# Patient Record
Sex: Male | Born: 1970 | Race: White | Hispanic: No | Marital: Married | Smoking: Current every day smoker
Health system: Southern US, Community
[De-identification: ages and names within clinical notes are randomized; demographics above are authoritative.]

## PROBLEM LIST (undated history)

## (undated) DIAGNOSIS — Q211 Atrial septal defect: Secondary | ICD-10-CM

## (undated) DIAGNOSIS — I340 Nonrheumatic mitral (valve) insufficiency: Secondary | ICD-10-CM

## (undated) DIAGNOSIS — R55 Syncope and collapse: Secondary | ICD-10-CM

## (undated) HISTORY — PX: TESTICLE SURGERY: SHX794

---

## 2013-10-19 DIAGNOSIS — Q2112 Patent foramen ovale: Secondary | ICD-10-CM

## 2013-10-19 DIAGNOSIS — Q211 Atrial septal defect: Secondary | ICD-10-CM

## 2013-10-19 DIAGNOSIS — I340 Nonrheumatic mitral (valve) insufficiency: Secondary | ICD-10-CM

## 2013-10-19 HISTORY — DX: Patent foramen ovale: Q21.12

## 2013-10-19 HISTORY — DX: Atrial septal defect: Q21.1

## 2013-10-19 HISTORY — DX: Nonrheumatic mitral (valve) insufficiency: I34.0

## 2013-12-25 HISTORY — PX: LOOP RECORDER IMPLANT: SHX5954

## 2014-09-23 ENCOUNTER — Emergency Department (HOSPITAL_COMMUNITY): Payer: Medicaid Other

## 2014-09-23 ENCOUNTER — Encounter (HOSPITAL_COMMUNITY): Payer: Self-pay | Admitting: Emergency Medicine

## 2014-09-23 ENCOUNTER — Emergency Department (HOSPITAL_COMMUNITY)
Admission: EM | Admit: 2014-09-23 | Discharge: 2014-09-23 | Disposition: A | Discharge status: Home / Self Care | Payer: Medicaid Other | Attending: Emergency Medicine | Admitting: Emergency Medicine

## 2014-09-23 DIAGNOSIS — R079 Chest pain, unspecified: Secondary | ICD-10-CM | POA: Diagnosis present

## 2014-09-23 DIAGNOSIS — Z720 Tobacco use: Secondary | ICD-10-CM | POA: Insufficient documentation

## 2014-09-23 DIAGNOSIS — Z79899 Other long term (current) drug therapy: Secondary | ICD-10-CM | POA: Insufficient documentation

## 2014-09-23 DIAGNOSIS — Q211 Atrial septal defect: Secondary | ICD-10-CM | POA: Insufficient documentation

## 2014-09-23 DIAGNOSIS — R55 Syncope and collapse: Secondary | ICD-10-CM | POA: Diagnosis not present

## 2014-09-23 DIAGNOSIS — R0789 Other chest pain: Secondary | ICD-10-CM | POA: Diagnosis not present

## 2014-09-23 DIAGNOSIS — Z8679 Personal history of other diseases of the circulatory system: Secondary | ICD-10-CM | POA: Insufficient documentation

## 2014-09-23 HISTORY — DX: Atrial septal defect: Q21.1

## 2014-09-23 HISTORY — DX: Syncope and collapse: R55

## 2014-09-23 HISTORY — DX: Nonrheumatic mitral (valve) insufficiency: I34.0

## 2014-09-23 LAB — BASIC METABOLIC PANEL
Anion gap: 12 (ref 5–15)
BUN: 11 mg/dL (ref 6–23)
CO2: 22 mEq/L (ref 19–32)
Calcium: 9.3 mg/dL (ref 8.4–10.5)
Chloride: 105 mEq/L (ref 96–112)
Creatinine, Ser: 0.85 mg/dL (ref 0.50–1.35)
GFR calc Af Amer: >90 mL/min (ref >90)
GFR calc non Af Amer: >90 mL/min (ref >90)
Glucose, Bld: 96 mg/dL (ref 70–99)
Potassium: 4.1 mEq/L (ref 3.7–5.3)
Sodium: 139 mEq/L (ref 137–147)

## 2014-09-23 LAB — CBC
HCT: 41.9 % (ref 39.0–52.0)
Hemoglobin: 15.1 g/dL (ref 13.0–17.0)
MCH: 29.5 pg (ref 26.0–34.0)
MCHC: 36 g/dL (ref 30.0–36.0)
MCV: 82 fL (ref 78.0–100.0)
Platelets: 230 10*3/uL (ref 150–400)
RBC: 5.11 MIL/uL (ref 4.22–5.81)
RDW: 12.2 % (ref 11.5–15.5)
WBC: 7.5 10*3/uL (ref 4.0–10.5)

## 2014-09-23 LAB — I-STAT TROPONIN, ED
Troponin i, poc: 0 ng/mL (ref 0.00–0.08)
Troponin i, poc: 0 ng/mL (ref 0.00–0.08)

## 2014-09-23 LAB — TROPONIN I

## 2014-09-23 MED ORDER — ASPIRIN 325 MG PO TABS
325.0000 mg | ORAL_TABLET | Freq: Once | ORAL | Status: AC
  Administered 2014-09-23: 325 mg via ORAL
  Filled 2014-09-23: qty 1

## 2014-09-23 MED ORDER — NITROGLYCERIN 0.4 MG SL SUBL: 0.4000 mg | SUBLINGUAL_TABLET | SUBLINGUAL | Status: AC | PRN

## 2014-09-23 MED ORDER — IOHEXOL 350 MG/ML SOLN
100.0000 mL | Freq: Once | INTRAVENOUS | Status: AC | PRN
  Administered 2014-09-23: 100 mL via INTRAVENOUS

## 2014-09-23 MED ORDER — MORPHINE SULFATE 4 MG/ML IJ SOLN
4.0000 mg | Freq: Once | INTRAMUSCULAR | Status: AC
  Administered 2014-09-23: 4 mg via INTRAVENOUS
  Filled 2014-09-23: qty 1

## 2014-09-23 NOTE — ED Provider Notes (Signed)
CSN: [id number]     Arrival date & time 09/23/14  0945 History   First MD Initiated Contact with Patient 09/23/14 1007     Chief Complaint  Patient presents with  . Chest Pain     (Consider location/radiation/quality/duration/timing/severity/associated sxs/prior Treatment) HPI Pt is a 43yo male with hx of syncopal episodes for 2 years presenting to ED with c/o right right shoulder pain radiating into center of chest.  Pain started around 5AM this morning, and mild and dull, progressively worsening to sharp, 10/10 at worse. 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  States he has had a TEE, stress test, EEG and brain MRI only to find a small hole in his heart and a "leaky valve" but states cardiology was not concerned with these findings and did not believe they were cause of pt's syncopal episodes. Denies having syncope today.  CP will come and go w/o known aggravating factors. Pt states it has come while he has been sitting in a chair, other times, has been while he is working on [employer] site. Besides findings on TEE, no other personal cardiac hx, denies family hx of CAD or sudden cardiac death.    Past Medical History  Diagnosis Date  . Syncopal episodes   . Mitral regurgitation 10/2013    seen on TEE  . PFO (patent foramen ovale) 10/2013    seen on TEE   Past Surgical History  Procedure Laterality Date  . Loop recorder implant  12/25/2013  . Testicle surgery      exploration of undescended testicle   Family History  Problem Relation Age of Onset  . Diabetes Father   . Hypertension Mother    History  Substance Use Topics  . Smoking status: Current Every Day Smoker -- 20 years  . Smokeless tobacco: Never Used  . Alcohol Use: Yes     Comment: Rare, no abuse    Review of Systems  Constitutional: Negative for fever, chills, diaphoresis and fatigue.  HENT:  Negative for sore throat, trouble swallowing and voice change.   Respiratory: Negative for cough, chest tightness, shortness of breath and wheezing.   Cardiovascular: Positive for chest pain (sharp, aching, right side of chest, radiaing into center). Negative for palpitations and leg swelling.  Gastrointestinal: Negative for nausea, vomiting and abdominal pain.  Neurological: Positive for syncope. Negative for weakness, light-headedness, numbness and headaches.      Allergies  Review of patient's allergies indicates no known allergies.  Home Medications   Prior to Admission medications   Medication Sig Start Date End Date Taking? Authorizing Provider  citalopram (CELEXA) 10 MG tablet Take 20 mg by mouth at bedtime.  07/21/14 07/22/15 Yes Historical Provider, MD  ibuprofen (ADVIL,MOTRIN) 200 MG tablet Take 600 mg by mouth every 8 (eight) hours as needed for headache (pain).   Yes Historical Provider, MD  nitroGLYCERIN (NITROSTAT) 0.4 MG SL tablet Place 1 tablet (0.4 mg total) under the tongue every 5 (five) minutes as needed for chest pain. 09/23/14   Rhonda G Barrett, PA-C   BP 104/54  Pulse 67  Temp(Src) 98.4 F (36.9 C) (Oral)  Resp 22  SpO2 100% Physical Exam  Nursing note and vitals reviewed. Constitutional: He is oriented to person, place, and time. He appears well-developed and well-nourished. He appears distressed.  Well nourished, tall male sitting up in exam bed, appears uncomfortable but non-toxic appearing.  HENT:  Head: Normocephalic and atraumatic.  Eyes: Conjunctivae are normal. No scleral icterus.  Neck: Normal range of motion. Neck supple.  Cardiovascular: Normal rate and regular rhythm.   Murmur heard. Pulmonary/Chest: Effort normal and breath sounds normal. No respiratory distress. He has no wheezes. He has no rales. He exhibits no tenderness.  No respiratory distress, able to speak in full sentences w/o difficulty. Lungs: CTAB  Abdominal: Soft. Bowel sounds are  normal. He exhibits no distension and no mass. There is no tenderness. There is no rebound and no guarding.  Musculoskeletal: Normal range of motion.  Neurological: He is alert and oriented to person, place, and time. No cranial nerve deficit. Coordination normal.  Alert to person, place and time. Speech fluent. 5/5 strength upper and lower extremities bilaterally  Skin: Skin is warm and dry. He is not diaphoretic.    ED Course  Procedures (including critical care time) Labs Review Labs Reviewed  CBC  BASIC METABOLIC PANEL  TROPONIN I  I-STAT TROPOININ, ED  Rosezena Sensor, ED    Imaging Review Dg Chest 2 View  09/23/2014   CLINICAL DATA:  Chest pain with radiation right shoulder; syncope earlier today. Difficulty breathing associated with the pain  EXAM: CHEST  2 VIEW  COMPARISON:  None.  FINDINGS: A monitor overlies the anterior left hemithorax. Lungs are clear. Heart size and pulmonary vascularity are normal. No pneumothorax. No adenopathy. No bone lesions. There is lower thoracic levoscoliosis.  IMPRESSION: No edema or consolidation.   Electronically Signed   By: Bretta Bang M.D.   On: 09/23/2014 11:49   Ct Angio Chest Aorta W/cm &/or Wo/cm  09/23/2014   CLINICAL DATA:  Right-sided chest pain of acute onset  EXAM: CT ANGIOGRAPHY CHEST WITH CONTRAST  TECHNIQUE: Initially, axial CT images were obtained through the chest without intravenous contrast material administration. Multidetector CT imaging of the chest was performed using the standard protocol during bolus administration of intravenous contrast. Multiplanar CT image reconstructions and MIPs were obtained to evaluate the vascular anatomy.  CONTRAST:  OMNIPAQUE IOHEXOL 350 MG/ML SOLN  COMPARISON:  Chest radiograph September 23, 2014  FINDINGS: There is no demonstrable pulmonary embolus. There is no thoracic aortic aneurysm or dissection. Great vessels appear normal in their visualized regions.  On axial slice 29 series 6,  there is a 2 mm nodular lesion in the anterior segment of the right upper lobe. Also on slice 29 series 6, there is a 2 mm nodular lesion there is a 2 mm nodular lesion posterior segment of the right upper lobe. On slice 30 series 2, there is a 2 mm nodular opacity in the posterior segment of the right upper lobe. On axial slice 56 series 6, there is a 5 mm nodular opacity in the medial aspect of the superior lingula. There is no edema or consolidation.  A monitor device is noted anterior soft tissues of the left hemithorax, midportion. There is no appreciable thoracic adenopathy. The pericardium is not thickened.  In the visualized upper abdomen, there is fatty change in the liver. There is a 6 mm adenoma in the left adrenal, a benign finding. Visualized thyroid appears normal. There are no blastic or lytic bone lesions.  Review of the MIP images confirms the above findings.  IMPRESSION: No thoracic aortic aneurysm or dissection. No demonstrable pulmonary embolus. No edema or consolidation. Nodular lesions in the lungs, largest measuring 5 mm. Followup of these nodular opacity should be based on Fleischner Society guidelines. If the  patient is at high risk for bronchogenic carcinoma, follow-up chest CT at 6-12 months is recommended. If the patient is at low risk for bronchogenic carcinoma, follow-up chest CT at 12 months is recommended. This recommendation follows the consensus statement: Guidelines for Management of Small Pulmonary Nodules Detected on CT Scans: A Statement from the Fleischner Society as published in Radiology 2005;237:395-400.  Fatty liver.  Small left adrenal adenoma.   Electronically Signed   By: Bretta BangWilliam  Woodruff M.D.   On: 09/23/2014 12:24     EKG Interpretation   Date/Time:  Tuesday September 23 2014 09:51:09 EDT Ventricular Rate:  87 PR Interval:  180 QRS Duration: 118 QT Interval:  368 QTC Calculation: 442 R Axis:   111 Text Interpretation:  Normal sinus rhythm Right atrial  enlargement Right  axis deviation Pulmonary disease pattern Incomplete right bundle branch  block Right ventricular hypertrophy Abnormal ECG agree. no STEMI Confirmed  by Donnald GarrePfeiffer, MD, Lebron ConnersMarcy [phone]([id number]) on 09/23/2014 2:58:20 PM      MDM   Final diagnoses:  Other chest pain    Pt is a 43yo male with of idiopathic syncope presenting to ED with worsening chest pain that has been intermittent for about 2 months but worse today. Started around 5AM.    Per medical records found through [facility], as of 11/05/13, pt [facility], found to have a 3+ mitral regurgitation with PFO via TEE. "These findings were presented to the patient and his wife. Both were known issues. 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   2:48 PM Consulted with cardiology, Dr. Herbie BaltimoreHarding, who examined pt. Pt states he does have a f/u appointment with his cardiologist for Monday, 10/12.  Will get a repeat troponin, if normal, pt may be discharged home to f/u with cardiologist as previously scheduled.    Junius Finnerrin O'Malley, PA-C 09/23/14 [phone]

## 2014-09-23 NOTE — ED Notes (Signed)
returned from ct

## 2014-09-23 NOTE — ED Provider Notes (Signed)
Medical screening examination/treatment/procedure(s) were conducted as a shared visit with non-physician practitioner(s) and myself.  I personally evaluated the patient during the encounter.   EKG Interpretation   Date/Time:  Tuesday September 23 2014 09:51:09 EDT Ventricular Rate:  87 PR Interval:  180 QRS Duration: 118 QT Interval:  368 QTC Calculation: 442 R Axis:   111 Text Interpretation:  Normal sinus rhythm Right atrial enlargement Right  axis deviation Pulmonary disease pattern Incomplete right bundle branch  block Right ventricular hypertrophy Abnormal ECG agree. no STEMI Confirmed  by Donnald GarrePfeiffer, MD, Lebron ConnersMarcy [phone]([id number]) on 09/23/2014 2:58:20 PM     I have assessed and examined the patient. Consultation was placed with Dr. Susette RacerHardy of cardiology. At this point time it has been determined that he is stable for discharge with continued followup with his cardiologist which is scheduled for Monday.  Arby BarretteMarcy Adelbert Gaspard, MD 09/23/14 [phone]

## 2014-09-23 NOTE — ED Notes (Signed)
Cardiology with pt.  

## 2014-09-23 NOTE — ED Notes (Signed)
Pt to radiology.

## 2014-09-23 NOTE — H&P (Signed)
History and Physical   Patient ID: KHUP SAPIA MRN: [id number], DOB/AGE: 41-20-1972 41 y.o. Date of Encounter: 09/23/2014  Primary Physician: None Primary Cardiologist: Landry Corporal, MD at [facility]  Chief Complaint:  Chest pain  HPI: Connor Henson is a 43 y.o. male with no history of CAD. He has had syncope in the past, multiple episodes, and has a loop recorder and previous echocardiogram was normal.  Today, he was in his usual state of health. He woke with upper right back pain that radiated around to his chest as an ache and sharp pain, at about 5 am. He went to work and loaded the [employer]. He was working when the pain became much worse, up to a 10/10.   The pain briefly made him SOB, but then was OK, he was a little diaphoretic, no N&V. Took no medications. He fell to his knees, the pain was severe when he was trying to breathe. Brought to ER by co-workers, pain gradually eased off, he was given ASA 325 mg and MSO4 4 mg. The pain is now 4-5/10. This is the lowest it has been all day. Last episode 2-3 days ago. 6/10 at its worst, lasted about 2 hours. The episodes are not associated with exertion. He has been getting these 2-3 x week for the last 2-3 months.   History of syncope, classified as vasovagal by his neurologist. Last syncopal episode was last week. They happen a little over 2 x month on average. Evaluated at [facility]. Positive tilt table, loop recorder implanted early this year with no arrhythmia found.    Past Medical History  Diagnosis Date  . Syncopal episodes   . Mitral regurgitation 10/2013    seen on TEE  . PFO (patent foramen ovale) 10/2013    seen on TEE    Surgical History:  Past Surgical History  Procedure Laterality Date  . Loop recorder implant  12/25/2013  . Testicle surgery      exploration of undescended testicle     I have reviewed the patient's current medications. Prior to Admission medications   Medication Sig Start Date End Date  Taking? Authorizing Provider  citalopram (CELEXA) 10 MG tablet Take 20 mg by mouth at bedtime.  07/21/14 07/22/15 Yes Historical Provider, MD  ibuprofen (ADVIL,MOTRIN) 200 MG tablet Take 600 mg by mouth every 8 (eight) hours as needed for headache (pain).   Yes Historical Provider, MD   Allergies: No Known Allergies  History   Social History  . Marital Status: Married    Spouse Name: N/A    Number of Children: N/A  . Years of Education: N/A   Occupational History  . Electrician    Social History Main Topics  . Smoking status: Current Every Day Smoker -- 20 years  . Smokeless tobacco: Never Used  . Alcohol Use: Yes     Comment: Rare, no abuse  . Drug Use: Yes    Special: Marijuana     Comment: Former user, extremely rare in the last 7 years  . Sexual Activity: Not on file   Other Topics Concern  . Not on file   Social History Narrative   Lives with family.    Family History  Problem Relation Age of Onset  . Diabetes Father   . Hypertension Mother    Family Status  Relation Status Death Age  . Mother Alive   . Father Alive     Review of Systems:  Full 14-point review of systems otherwise negative except as noted above.  Physical Exam: Blood pressure 102/68, pulse 62, temperature 98.4 F (36.9 C), temperature source Oral, resp. rate 18, SpO2 100.00%. General: Well developed, well nourished,male in no acute distress. Head: Normocephalic, atraumatic, sclera non-icteric, no xanthomas, nares are without discharge. Dentition: good Neck: No carotid bruits. JVD not elevated. No thyromegally Lungs: Good expansion bilaterally. without wheezes or rhonchi. Prolonged expiratory phase Heart: Regular rate and rhythm with S1 S2.  No S3 or S4.  2/6 murmur, no rubs, or gallops appreciated. Abdomen: Soft, non-tender, non-distended with normoactive bowel sounds. No hepatomegaly. No rebound/guarding. No obvious abdominal masses. Msk:  Strength and tone appear normal for age. No joint  deformities or effusions, no spine or costo-vertebral angle tenderness. Extremities: No clubbing or cyanosis. No edema.  Distal pedal pulses are 2+ in 4 extrem Neuro: Alert and oriented X 3. Moves all extremities spontaneously. No focal deficits noted. Psych:  Responds to questions appropriately with a normal affect. Skin: No rashes or lesions noted  Labs:   Lab Results  Component Value Date   WBC 7.5 09/23/2014   HGB 15.1 09/23/2014   HCT 41.9 09/23/2014   MCV 82.0 09/23/2014   PLT 230 09/23/2014     Recent Labs Lab 09/23/14 1005  NA 139  K 4.1  CL 105  CO2 22  BUN 11  CREATININE 0.85  CALCIUM 9.3  GLUCOSE 96    Recent Labs  09/23/14 1018  TROPIPOC 0.00    Radiology/Studies: Dg Chest 2 View 09/23/2014   CLINICAL DATA:  Chest pain with radiation right shoulder; syncope earlier today. Difficulty breathing associated with the pain  EXAM: CHEST  2 VIEW  COMPARISON:  None.  FINDINGS: A monitor overlies the anterior left hemithorax. Lungs are clear. Heart size and pulmonary vascularity are normal. No pneumothorax. No adenopathy. No bone lesions. There is lower thoracic levoscoliosis.  IMPRESSION: No edema or consolidation.   Electronically Signed   By: Bretta BangWilliam  Woodruff M.D.   On: 09/23/2014 11:49   Ct Angio Chest Aorta W/cm &/or Wo/cm 09/23/2014   CLINICAL DATA:  Right-sided chest pain of acute onset  EXAM: CT ANGIOGRAPHY CHEST WITH CONTRAST  TECHNIQUE: Initially, axial CT images were obtained through the chest without intravenous contrast material administration. Multidetector CT imaging of the chest was performed using the standard protocol during bolus administration of intravenous contrast. Multiplanar CT image reconstructions and MIPs were obtained to evaluate the vascular anatomy.  CONTRAST:  100mL OMNIPAQUE IOHEXOL 350 MG/ML SOLN  COMPARISON:  Chest radiograph September 23, 2014  FINDINGS: There is no demonstrable pulmonary embolus. There is no thoracic aortic aneurysm or dissection.  Great vessels appear normal in their visualized regions.  On axial slice 29 series 6, there is a 2 mm nodular lesion in the anterior segment of the right upper lobe. Also on slice 29 series 6, there is a 2 mm nodular lesion there is a 2 mm nodular lesion posterior segment of the right upper lobe. On slice 30 series 2, there is a 2 mm nodular opacity in the posterior segment of the right upper lobe. On axial slice 56 series 6, there is a 5 mm nodular opacity in the medial aspect of the superior lingula. There is no edema or consolidation.  A monitor device is noted anterior soft tissues of the left hemithorax, midportion. There is no appreciable thoracic adenopathy. The pericardium is not thickened.  In the visualized upper abdomen, there is fatty change in  the liver. There is a 6 mm adenoma in the left adrenal, a benign finding. Visualized thyroid appears normal. There are no blastic or lytic bone lesions.  Review of the MIP images confirms the above findings.  IMPRESSION: No thoracic aortic aneurysm or dissection. No demonstrable pulmonary embolus. No edema or consolidation. Nodular lesions in the lungs, largest measuring 5 mm. Followup of these nodular opacity should be based on Fleischner Society guidelines. If the patient is at high risk for bronchogenic carcinoma, follow-up chest CT at 6-12 months is recommended. If the patient is at low risk for bronchogenic carcinoma, follow-up chest CT at 12 months is recommended. This recommendation follows the consensus statement: Guidelines for Management of Small Pulmonary Nodules Detected on CT Scans: A Statement from the Fleischner Society as published in Radiology 2005;237:395-400.  Fatty liver.  Small left adrenal adenoma.   Electronically Signed   By: Bretta Bang M.D.   On: 09/23/2014 12:24   Transesophageal Echo: 10/2013 Interpretation Summary A transesophageal echocardiogram with Doppler and color flow Doppler was performed. Saline contrast injection  was performed. There is no comparison study available. A patent foramen ovale is suspected. Injection of contrast documented an interatrial shunt. The left ventricle is normal in size, wall thickness and wall motion with ejection fraction of 60-65%. The right ventricular ejection fraction is grossly normal. There is mild tricuspid regurgitation (1+). Left Ventricle The left ventricle is normal in size, wall thickness and wall motion with ejection fraction of 60-65%. Right Ventricle The right ventricle is normal size. The right ventricular ejection fraction is grossly normal. Atria A patent foramen ovale is suspected. Injection of contrast documented an interatrial shunt. The left atrium is normal size. The right atrium is normal. Mitral Valve The mitral valve is mildly thickened. Anterior leaflet is more thickened. There is 3+ MR, eccentric jet. No flow reversal. Tricuspid Valve The tricuspid valve leaflets are thin and pliable and the valve motion is normal. There is mild tricuspid regurgitation (1+). Aortic Valve The aortic valve is tri-leaflet with thin, pliable leaflets that move normally. Pulmonic Valve The pulmonic valve leaflets are thin and pliable; valve motion is normal. Vessels The aortic root is normal. The pulmonary artery is normal. Pericardium There is no pericardial effusion.   ECG: SR, Incomplete RBBB  ASSESSMENT AND PLAN:  Principal Problem:   Chest pain, moderate coronary artery risk - MD advise on rechecking enzymes and discharge if negative, for outpatient stress test with primary cardiologist. MD advise on adding SLNTG.  SignedTheodore Demark, PA-C 09/23/2014 2:35 PM Beeper [phone]  I saw & examined the patient in the ER along with Theodore Demark, PA-C.   He has a long H/o of syncope -- with detailed evaluation by his primary Cardiologist.  Although I cannot find a ST result, he indicates that he thinks he has had a ST.  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    His current complaint is essentially R shoulder pain radiating from the trapezius to clavicle then spreads across chest. The symptom is persistent & not exacerbated by physical activity or shoulder manipulation. While I think that is is not unreasonable to assess with a Stress Test, I think that he is overall Low Risk from a Cardiac Standpoint with Negative Troponin Levels.  He has an Appt with his cardiologist this Monday.  Would defer evaluation to his cardiologist & to be done where the rest of his records are. Agree with PRN SL NTG.  BP & HR  are stable.    He is OK to d/c from the ER give Neg Trop ~12 hr post onset of constant pain.  Marykay Lex, M.D., M.S. Interventional Cardiologist   Pager # [phone]

## 2014-09-23 NOTE — ED Notes (Signed)
Pt c/o right sided CP starting at shoulder and into chest x 2 years intermittent that is worse today; pt sts hx of idiopathic syncope but denies today

## 2014-09-23 NOTE — ED Notes (Signed)
Patient was given a sprite to drink.

## 2014-09-23 NOTE — Discharge Instructions (Signed)

## 2014-09-30 NOTE — ED Provider Notes (Signed)
Medical screening examination/treatment/procedure(s) were conducted as a shared visit with non-physician practitioner(s) and myself.  I personally evaluated the patient during the encounter.   EKG Interpretation   Date/Time:  Tuesday September 23 2014 09:51:09 EDT Ventricular Rate:  87 PR Interval:  180 QRS Duration: 118 QT Interval:  368 QTC Calculation: 442 R Axis:   111 Text Interpretation:  Normal sinus rhythm Right atrial enlargement Right  axis deviation Pulmonary disease pattern Incomplete right bundle branch  block Right ventricular hypertrophy Abnormal ECG agree. no STEMI Confirmed  by Donnald GarrePfeiffer, MD, Lebron ConnersMarcy [phone]([id number]) on 09/23/2014 2:58:20 PM       Arby BarretteMarcy Auna Mikkelsen, MD 09/30/14 [phone]

## 2016-04-29 IMAGING — CT CT ANGIO CHEST
2 of 8 series · 18 of 46 positions shown · IV contrast (OMNI)
Comparison: Chest radiograph September 23, 2014

CLINICAL DATA: Right-sided chest pain of acute onset

EXAM:
CT ANGIOGRAPHY CHEST WITH CONTRAST
TECHNIQUE: Initially, axial CT images were obtained through the chest without
intravenous contrast material administration. Multidetector CT
imaging of the chest was performed using the standard protocol
during bolus administration of intravenous contrast. Multiplanar CT
image reconstructions and MIPs were obtained to evaluate the
vascular anatomy.
CONTRAST:  100mL OMNIPAQUE IOHEXOL 350 MG/ML SOLN

[Series 5: dissection 3.0 i30f 3 · axial · 0.78mm/px · z∈[-379,-43]mm · 15 of 126 slices shown]
[im 7/126  lung]
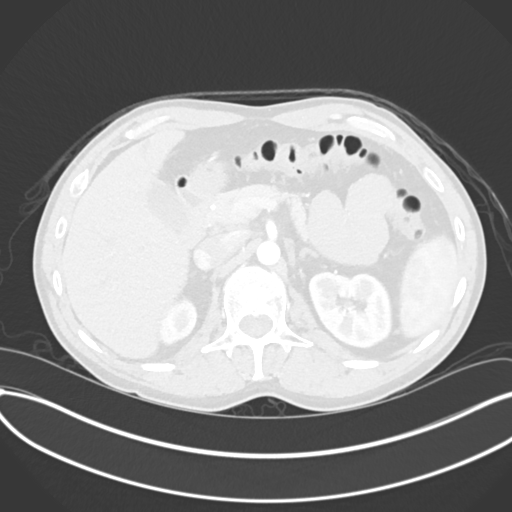
[im 14/126  soft-tissue]
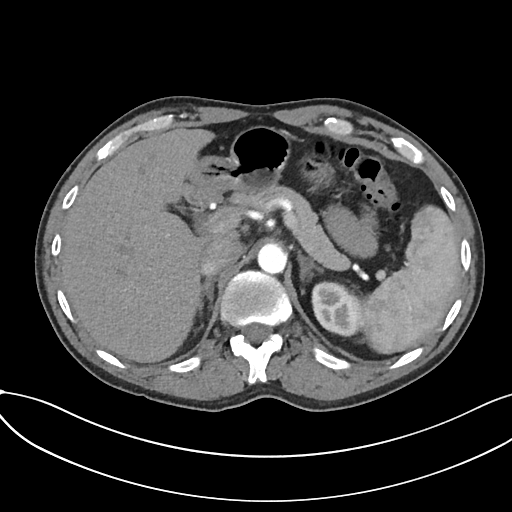
[im 21/126  lung]
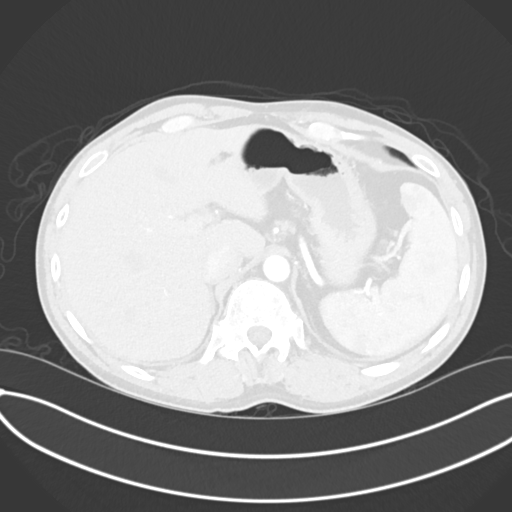
[im 28/126  soft-tissue]
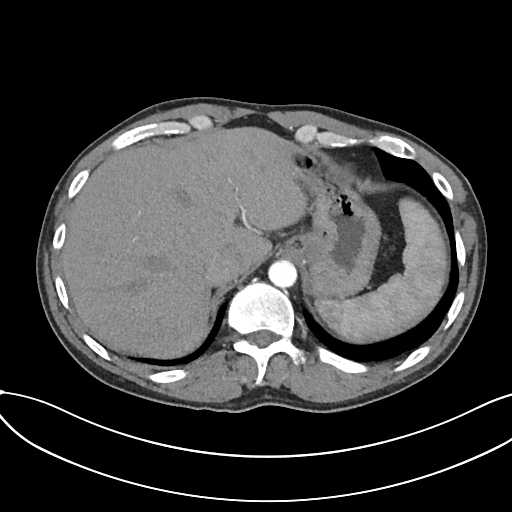
[im 42/126  lung]
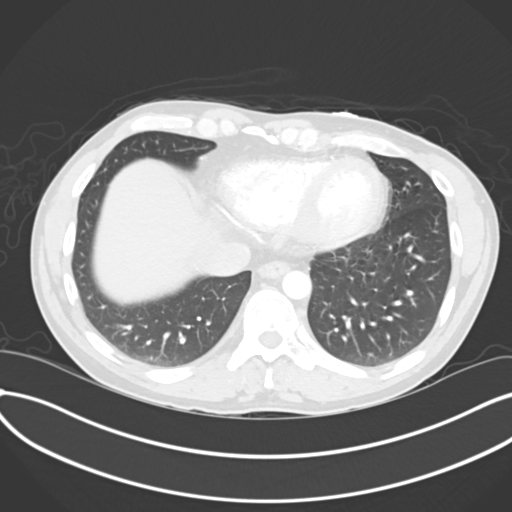
[im 49/126  soft-tissue]
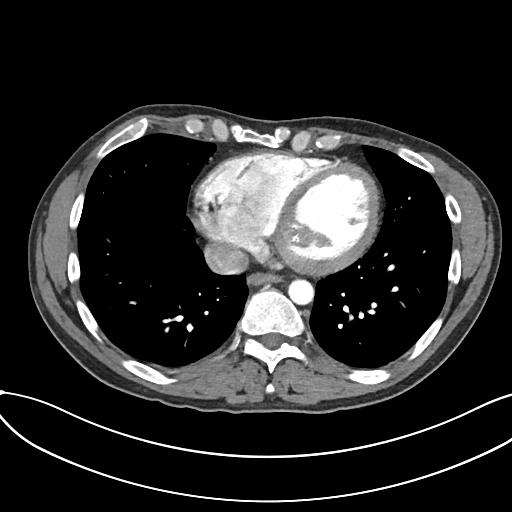
[im 56/126  lung]
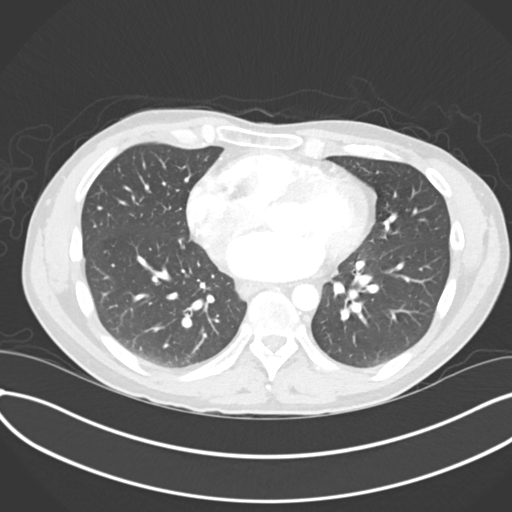
[im 63/126  soft-tissue]
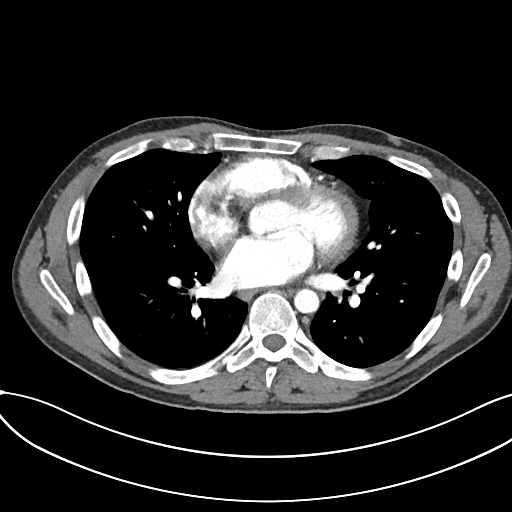
[im 70/126  lung]
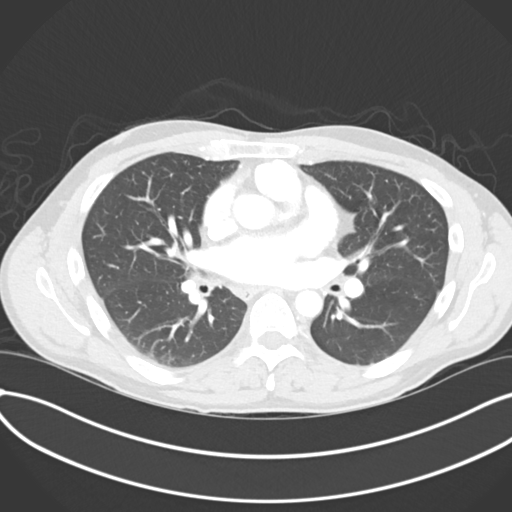
[im 77/126  soft-tissue]
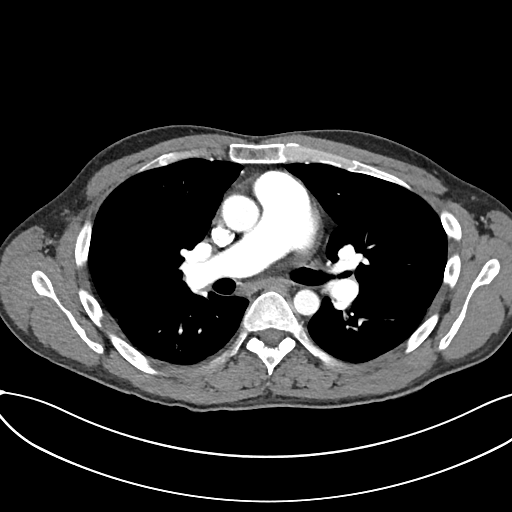
[im 84/126  lung]
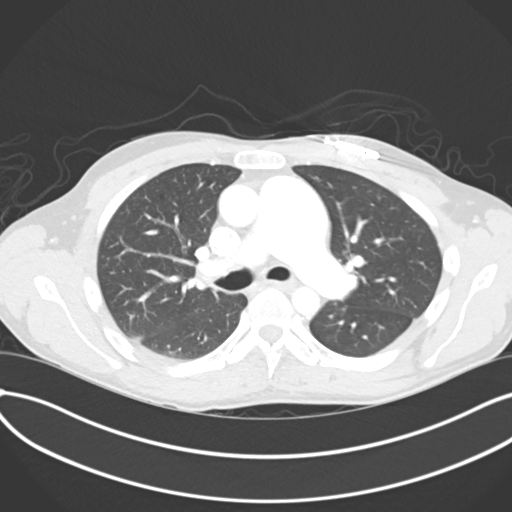
[im 98/126  soft-tissue]
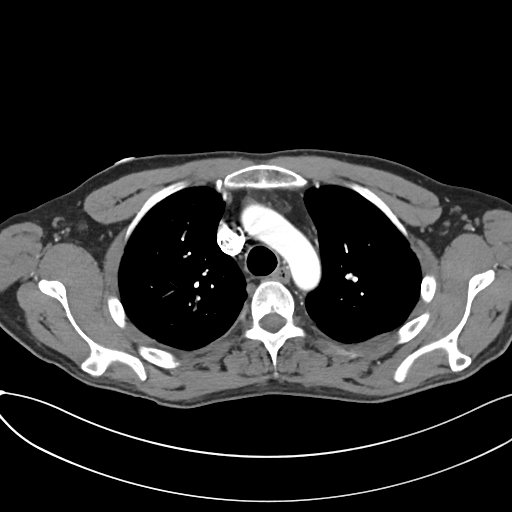
[im 105/126  lung]
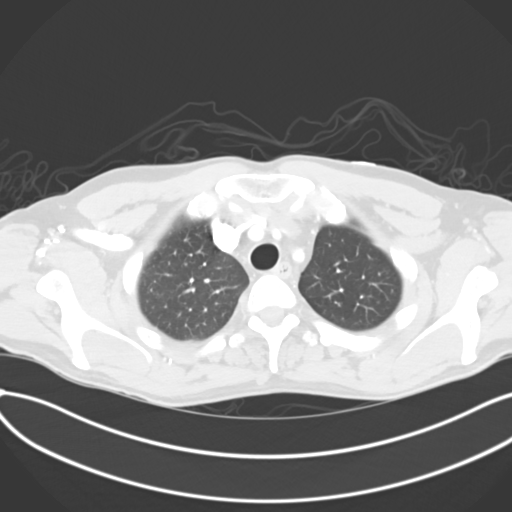
[im 112/126  soft-tissue]
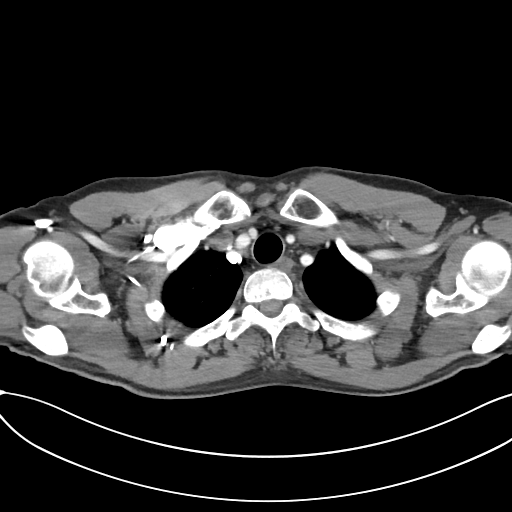
[im 119/126  lung]
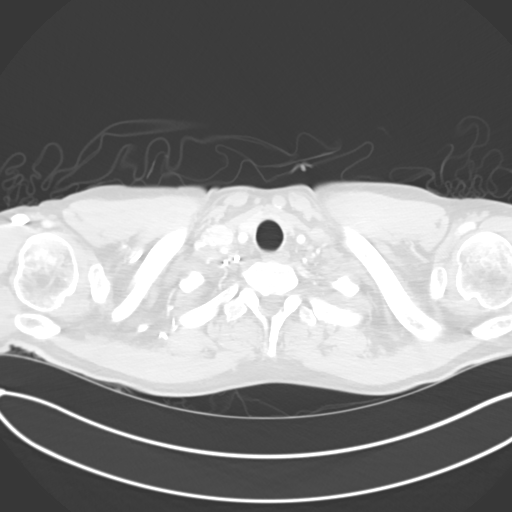

[Series 8: coronals · coronal · 0.78mm/px · 3 of 118 slices shown]
[im 30/118  soft-tissue]
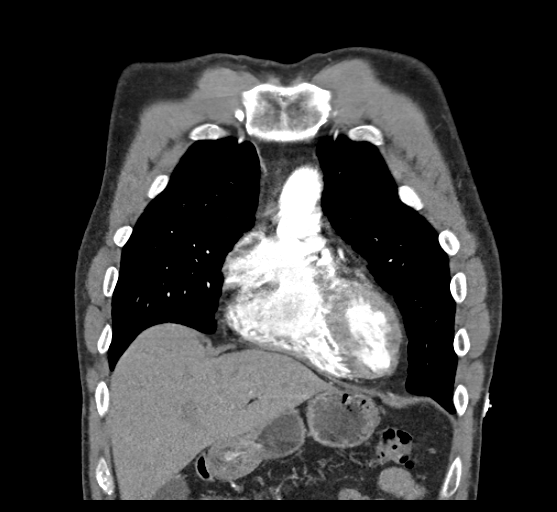
[im 59/118  soft-tissue]
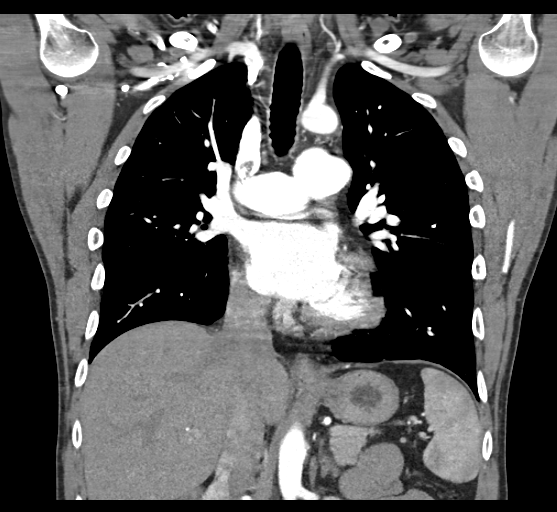
[im 88/118  soft-tissue]
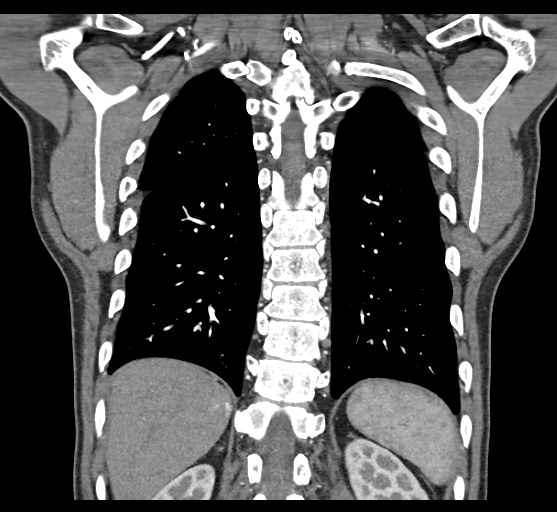

[18 of 46 positions shown; findings below may reference images not displayed]

FINDINGS: There is no demonstrable pulmonary embolus. There is no thoracic
aortic aneurysm or dissection. Great vessels appear normal in their
visualized regions.

On axial slice 29 series 6, there is a 2 mm nodular lesion in the
anterior segment of the right upper lobe. Also on slice 29 series 6,
there is a 2 mm nodular lesion there is a 2 mm nodular lesion
posterior segment of the right upper lobe. On slice 30 series 2,
there is a 2 mm nodular opacity in the posterior segment of the
right upper lobe. On axial slice 56 series 6, there is a 5 mm
nodular opacity in the medial aspect of the superior lingula. There
is no edema or consolidation.

A monitor device is noted anterior soft tissues of the left
hemithorax, midportion. There is no appreciable thoracic adenopathy.
The pericardium is not thickened.

In the visualized upper abdomen, there is fatty change in the liver.
There is a 6 mm adenoma in the left adrenal, a benign finding.
Visualized thyroid appears normal. There are no blastic or lytic
bone lesions.

Review of the MIP images confirms the above findings.
IMPRESSION: No thoracic aortic aneurysm or dissection. No demonstrable pulmonary
embolus. No edema or consolidation. Nodular lesions in the lungs,
largest measuring 5 mm. Followup of these nodular opacity should be
based on [HOSPITAL] guidelines. If the patient is at high
risk for bronchogenic carcinoma, follow-up chest CT at 6-12 months
is recommended. If the patient is at low risk for bronchogenic
carcinoma, follow-up chest CT at 12 months is recommended. This
recommendation follows the consensus statement: Guidelines for
Management of Small Pulmonary Nodules Detected on CT Scans: A
Statement from the [HOSPITAL] as published in Radiology
1447;[DATE].

Fatty liver.  Small left adrenal adenoma.

## 2016-04-29 IMAGING — CR DG CHEST 2V
2 series · 2 of 2 positions shown · non-contrast
Comparison: None.

CLINICAL DATA: Chest pain with radiation right shoulder; syncope
earlier today. Difficulty breathing associated with the pain

EXAM:
CHEST  2 VIEW

[w chest pa]
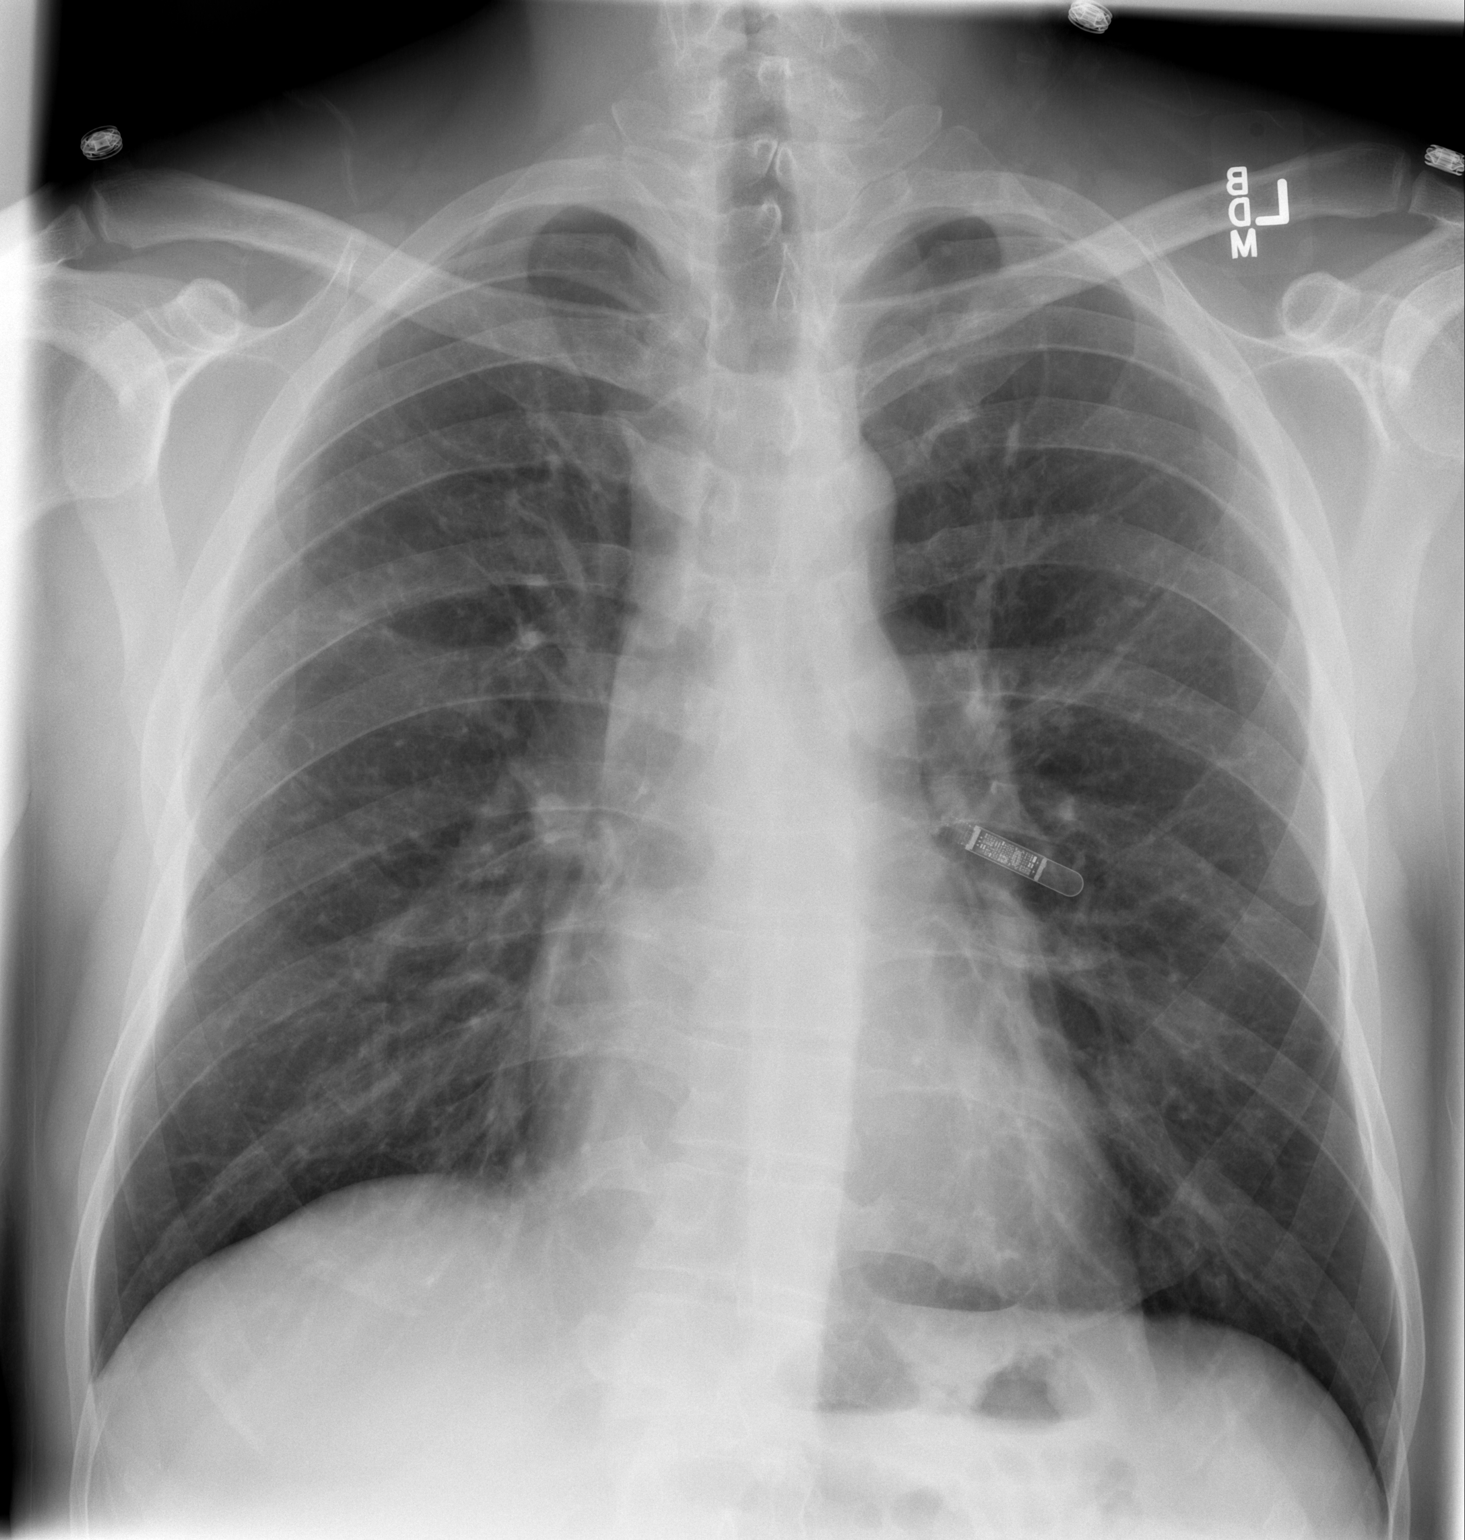

[w chest lat]
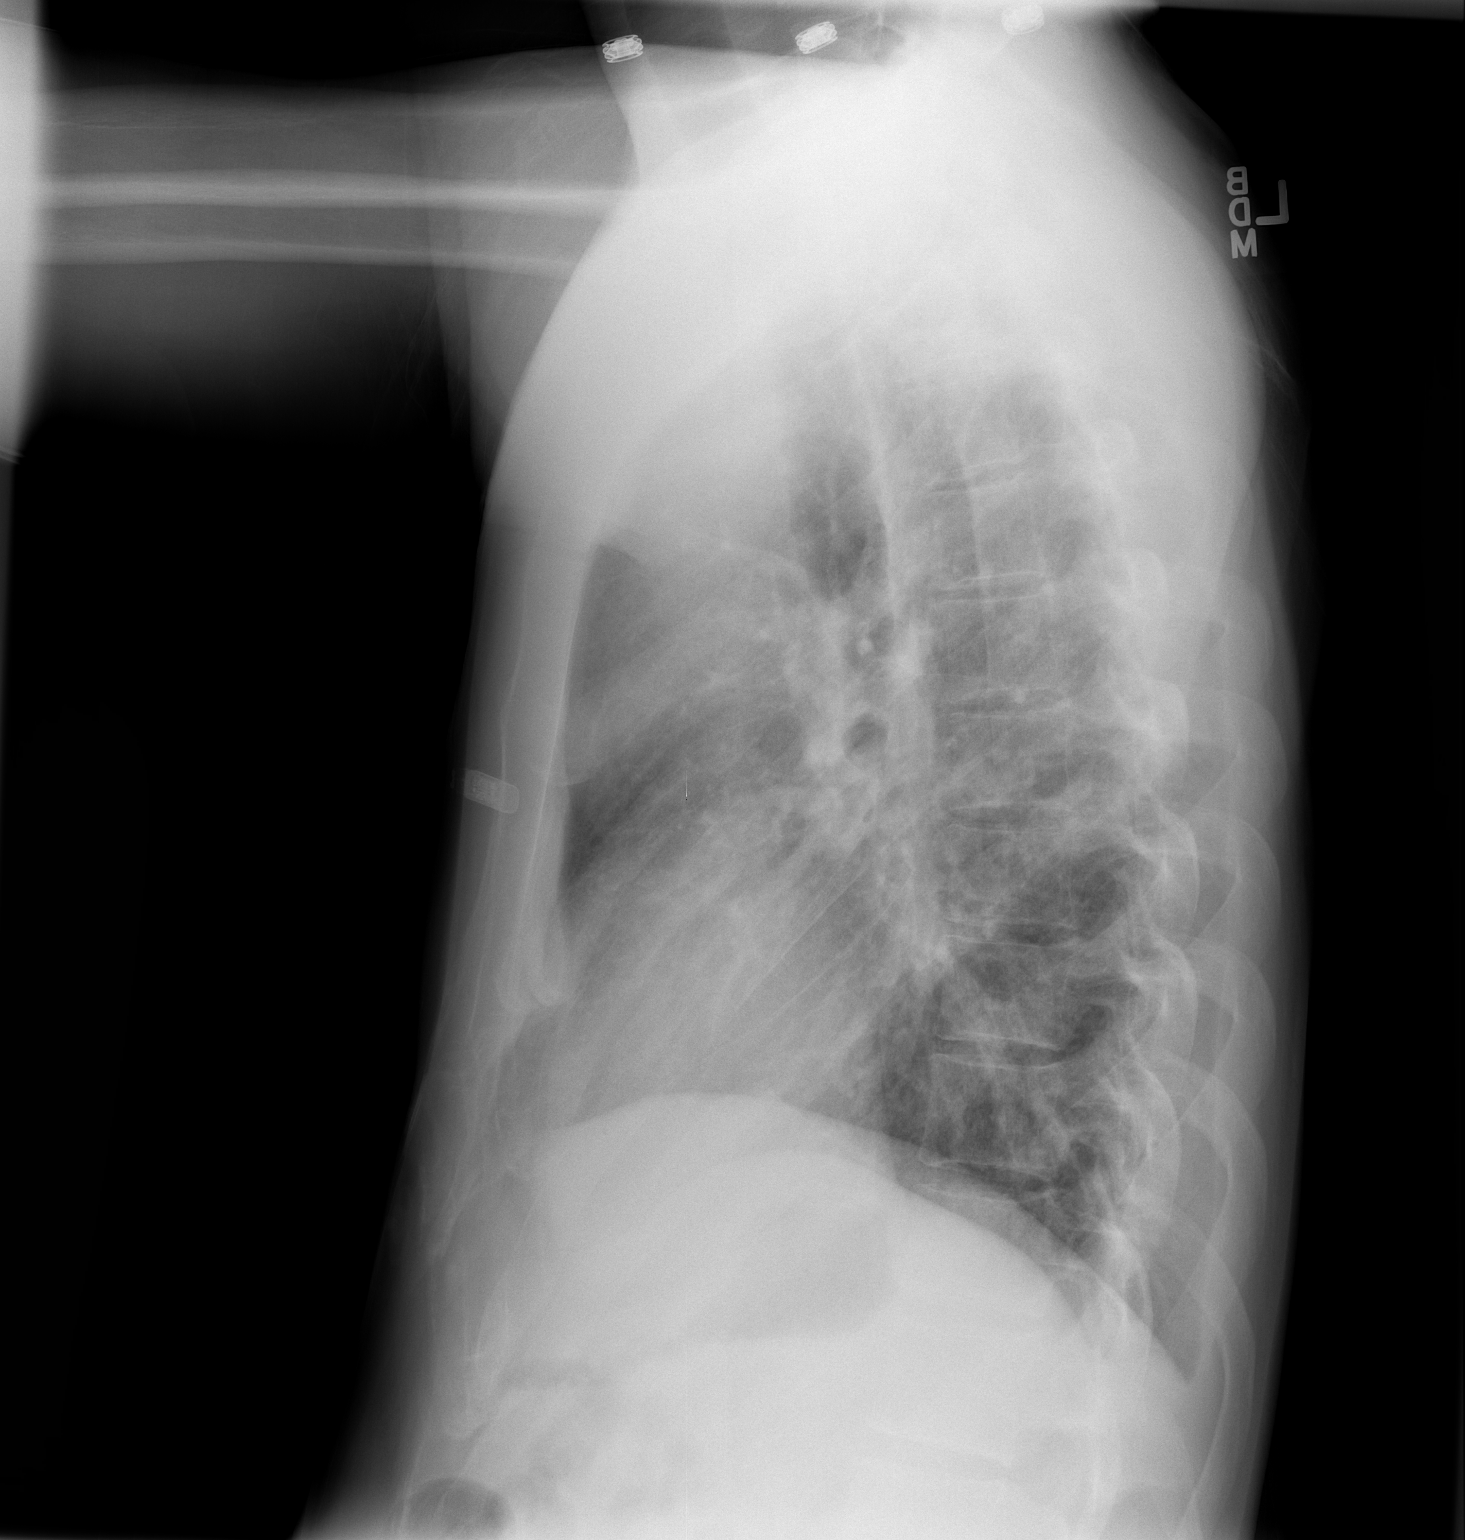

[2 of 2 positions shown; findings below may reference images not displayed]

FINDINGS: A monitor overlies the anterior left hemithorax. Lungs are clear.
Heart size and pulmonary vascularity are normal. No pneumothorax. No
adenopathy. No bone lesions. There is lower thoracic levoscoliosis.
IMPRESSION: No edema or consolidation.
